# Patient Record
Sex: Female | Born: 1980 | Race: White | Hispanic: No | Marital: Married
Health system: Midwestern US, Community
[De-identification: ages and names within clinical notes are randomized; demographics above are authoritative.]

---

## 2006-07-29 LAB — CULTURE, URINE
Culture: NO GROWTH
Report Status: 4242008

## 2007-08-30 NOTE — Op Note (Signed)
Breast Mass Excision Procedure Note    Indications: This patient has a palpable mass of the right breast, and is here for diagnostic excision.    Pre-operative Diagnosis: right breast mass    Post-operative Diagnosis: right breast mass    Surgeon:  Dola Argyle. Maple Hudson, MD    Assistant:  Surgeon(s):  Dola Argyle. Maple Hudson, MD    Anesthesia: MAC      Procedure Details   The patient was taken to Operating Room # 8, identified as Sally Mendoza and the procedure verified as Breast Nodule Excision. The site of surgery properly noted/marked.  A Time Out was held and the above information confirmed.    The patient was placed supine. The breast was prepped and draped in the standard fashion.       An incision was created over a palpable mass.  Dissection was carried down through the subcutaneous fat to identify a well-circumscribed 1 cm nodule, which was completely excised and submitted fresh to pathology.  Hemostasis was achieved.  Closure was performed  with a subcuticular closure.      Steri-Strips were applied. At the end of the operation all sponge, instrument and needle counts were correct times 2.  Patient tolerated the procedure well.  The patient was transferred to recovery room in stable condition.      Findings: palpable mass probable fibrocystic disease    Estimated Blood Loss:   ,15CC           Drains: NONE                  Complications:  None; patient tolerated the procedure well.
